# Patient Record
Sex: Female | Born: 1980 | Race: White | Hispanic: No | Marital: Married | Smoking: Never smoker
Health system: Southern US, Community
[De-identification: ages and names within clinical notes are randomized; demographics above are authoritative.]

## PROBLEM LIST (undated history)

## (undated) DIAGNOSIS — E079 Disorder of thyroid, unspecified: Secondary | ICD-10-CM

## (undated) DIAGNOSIS — IMO0002 Reserved for concepts with insufficient information to code with codable children: Secondary | ICD-10-CM

## (undated) HISTORY — PX: WISDOM TOOTH EXTRACTION: SHX21

## (undated) HISTORY — DX: Reserved for concepts with insufficient information to code with codable children: IMO0002

## (undated) HISTORY — DX: Disorder of thyroid, unspecified: E07.9

---

## 2004-11-01 ENCOUNTER — Emergency Department (HOSPITAL_COMMUNITY): Admission: EM | Admit: 2004-11-01 | Discharge: 2004-11-01 | Payer: Self-pay | Admitting: Emergency Medicine

## 2005-11-01 ENCOUNTER — Emergency Department (HOSPITAL_COMMUNITY): Admission: EM | Admit: 2005-11-01 | Discharge: 2005-11-02 | Payer: Self-pay | Admitting: Emergency Medicine

## 2009-11-24 ENCOUNTER — Encounter: Admission: RE | Admit: 2009-11-24 | Discharge: 2009-11-24 | Payer: Self-pay | Admitting: Family Medicine

## 2011-05-09 IMAGING — US US ABDOMEN COMPLETE
1 series · 13 of 25 positions shown · non-contrast
Comparison: None

CLINICAL DATA: Elevated liver function studies and nausea.

COMPLETE ABDOMINAL ULTRASOUND

[Series 1: us abdomen complete · 0.33mm/px · 13 of 57 slices shown]
[im 1/57]
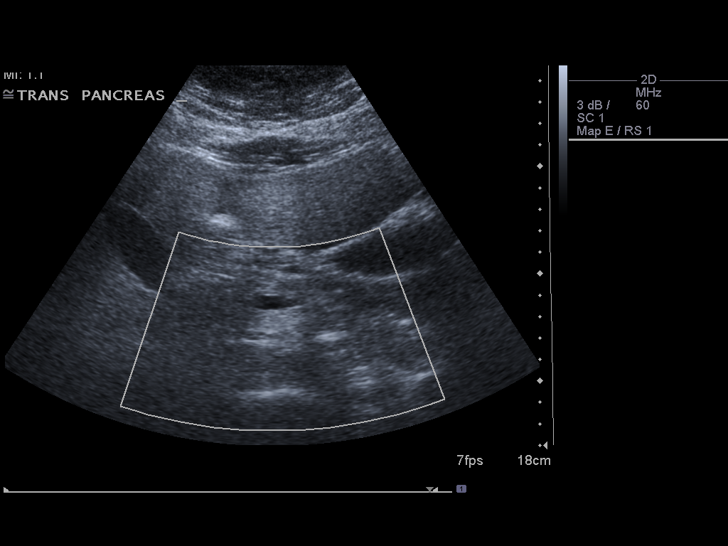
[im 5/57]
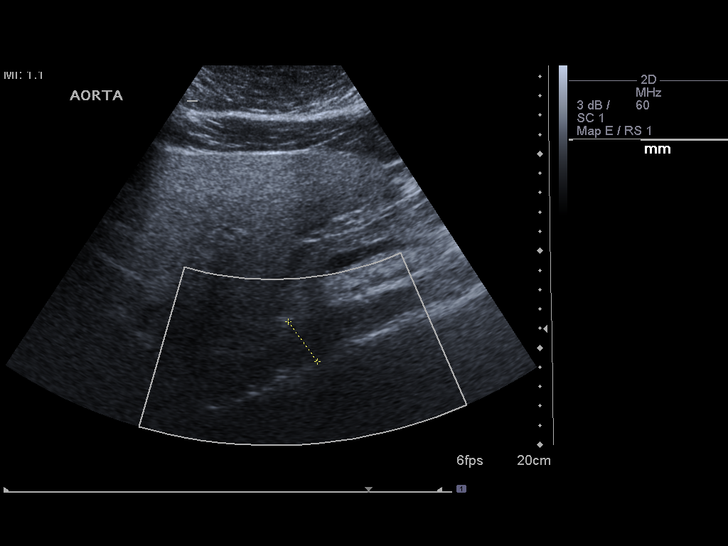
[im 10/57]
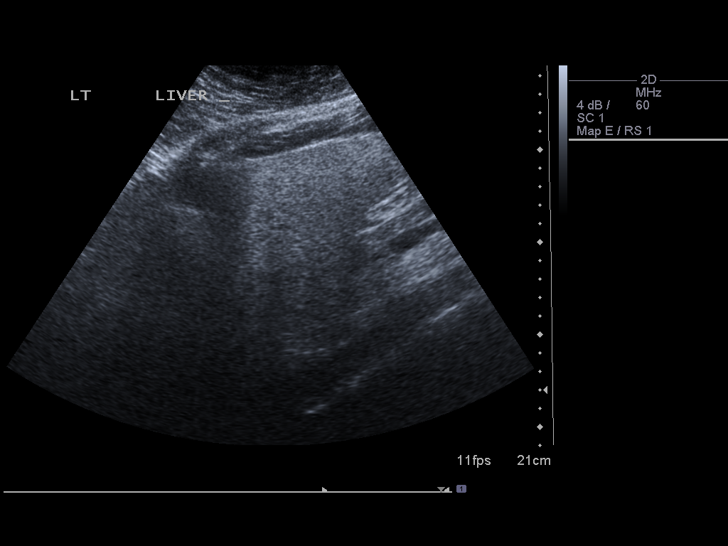
[im 15/57]
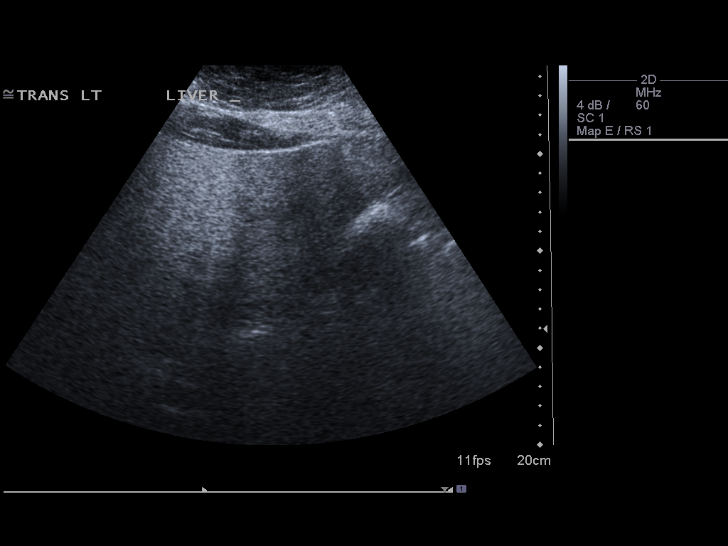
[im 19/57]
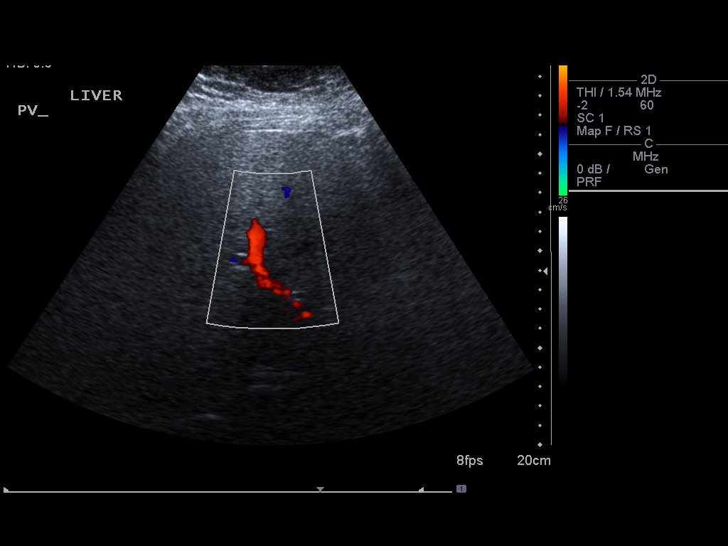
[im 24/57]
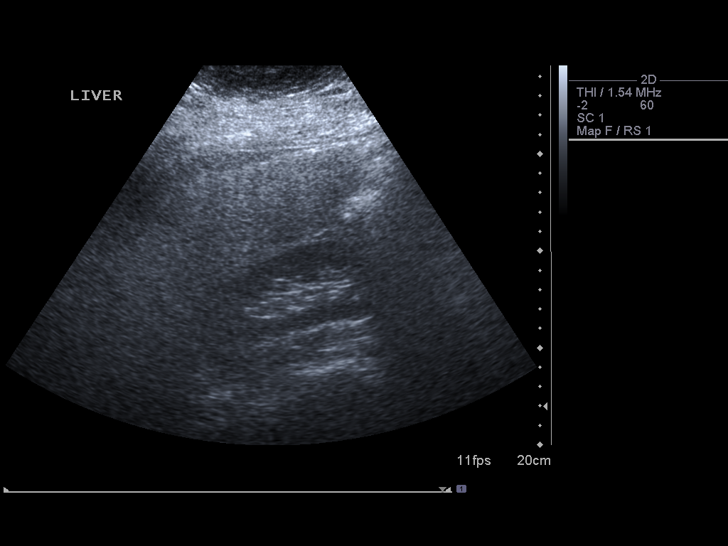
[im 29/57]
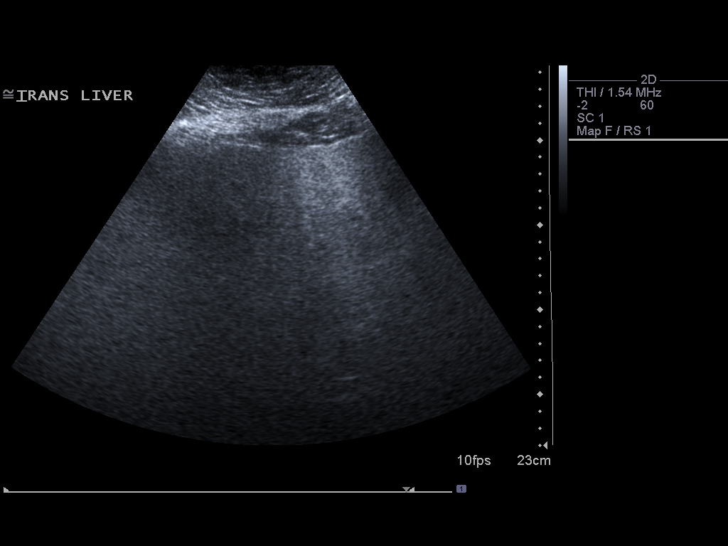
[im 33/57]
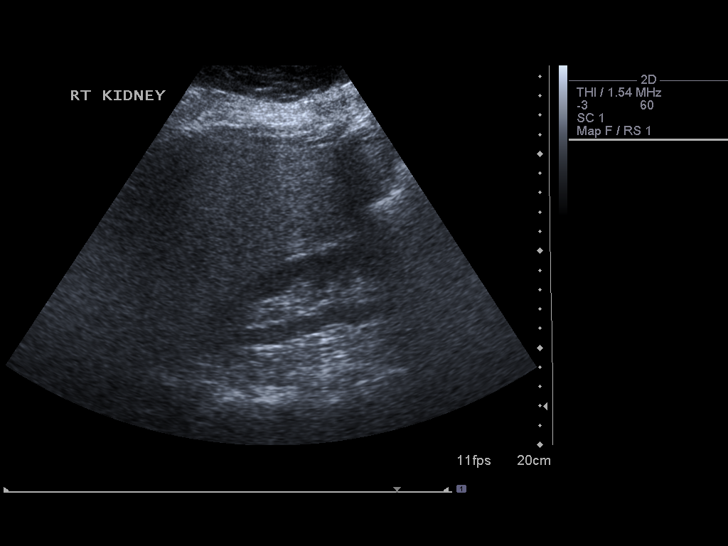
[im 38/57]
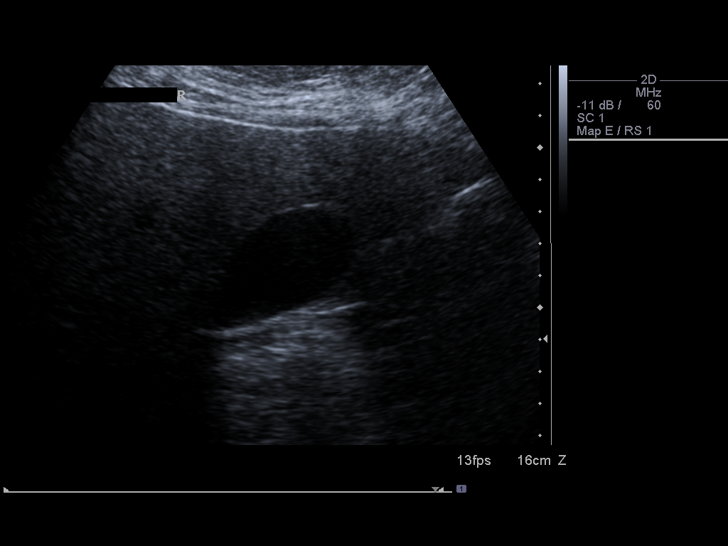
[im 43/57]
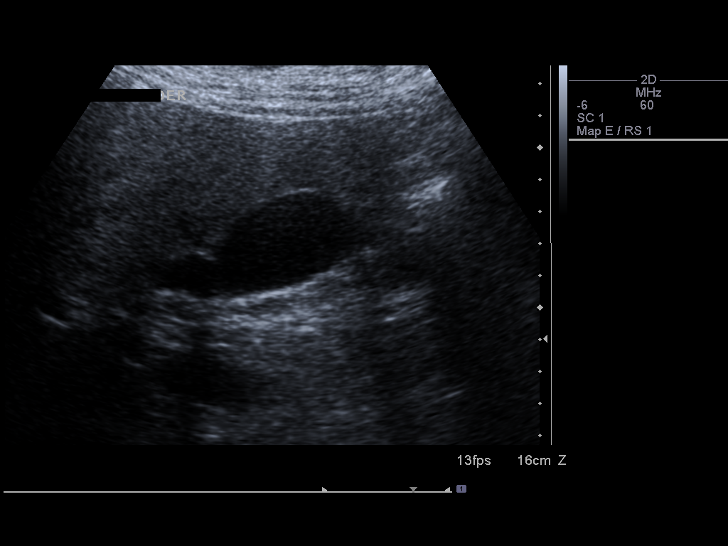
[im 47/57]
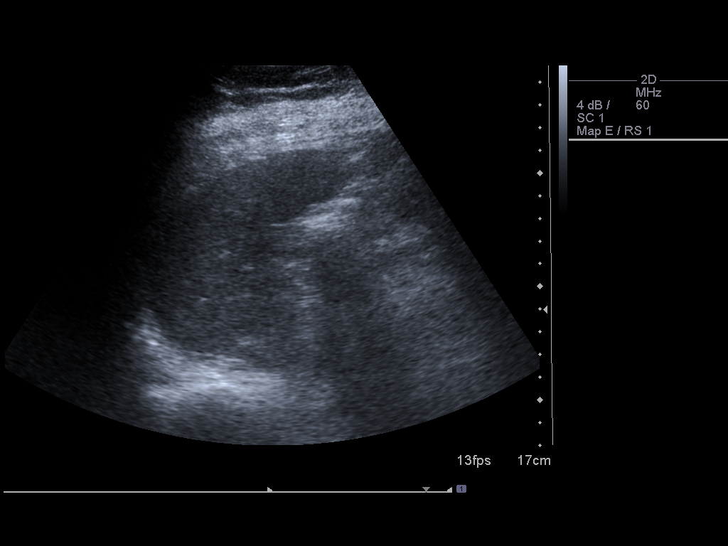
[im 52/57]
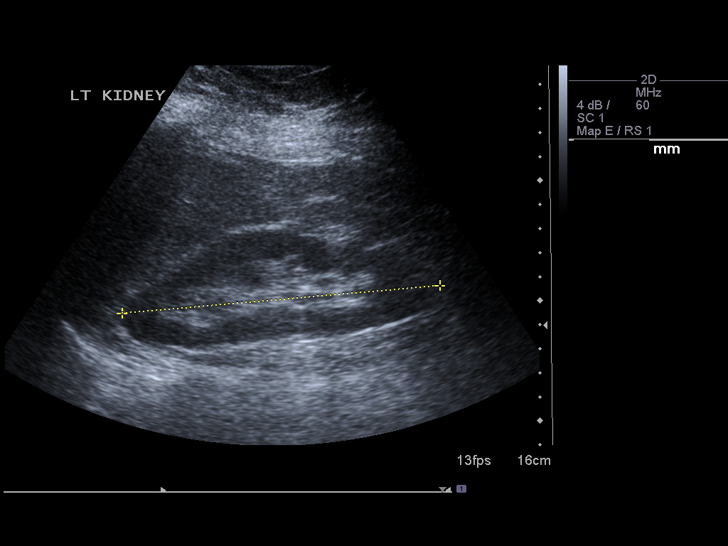
[im 57/57]
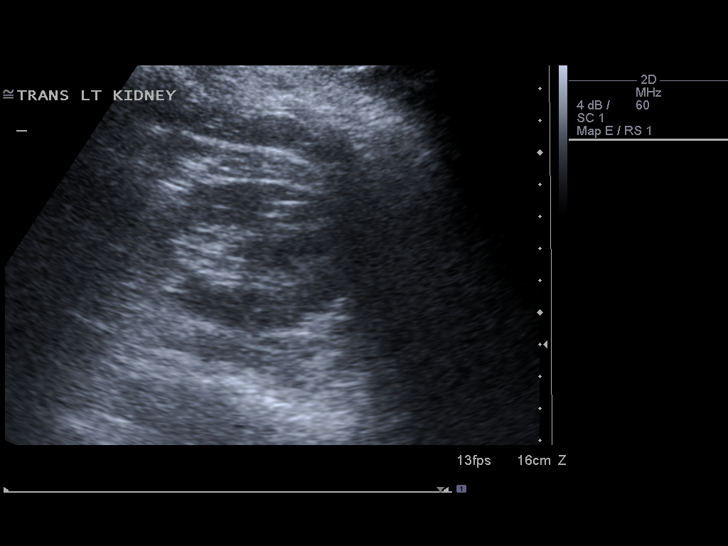

[13 of 25 positions shown; findings below may reference images not displayed]

FINDINGS: Gallbladder:  No gallstones, gallbladder wall thickening, or
pericholecystic fluid.

Common bile duct:  Normal in caliber measuring a maximum of 3.3 mm.

Liver:  Diffuse increased echogenicity with poor through
transmission and poor definition of the liver architecture
suggesting fatty infiltration.  There is an area of focal fatty
sparing near the gallbladder.

IVC:  Normal in caliber.

Pancreas:  Sonographically unremarkable.

Spleen:  Normal in size and echogenicity without focal lesions.

Right Kidney:  11.7 cm in length. Normal renal cortical thickness
and echogenicity without focal lesions or hydronephrosis.

Left Kidney:  13.3 cm in length. Normal renal cortical thickness
and echogenicity without focal lesions or hydronephrosis.

Abdominal aorta:  Normal caliber.
IMPRESSION: 1.  Diffuse fatty infiltration of the liver with area of probable
focal fatty sparing near the gallbladder.
2.  Normal sonographic appearance of the gallbladder and normal
caliber common bile duct.
3.  Normal sonographic appearance of the pancreas, spleen and both
kidneys.

## 2012-03-05 ENCOUNTER — Ambulatory Visit: Payer: Self-pay | Admitting: Family Medicine

## 2012-03-05 VITALS — HR 89 | Temp 98.9°F | Resp 18 | Ht 68.0 in | Wt 256.0 lb

## 2012-03-05 DIAGNOSIS — Z309 Encounter for contraceptive management, unspecified: Secondary | ICD-10-CM

## 2012-03-05 DIAGNOSIS — B379 Candidiasis, unspecified: Secondary | ICD-10-CM

## 2012-03-05 DIAGNOSIS — IMO0001 Reserved for inherently not codable concepts without codable children: Secondary | ICD-10-CM

## 2012-03-05 DIAGNOSIS — R21 Rash and other nonspecific skin eruption: Secondary | ICD-10-CM

## 2012-03-05 MED ORDER — NYSTATIN 100000 UNIT/GM EX POWD: Freq: Three times a day (TID) | CUTANEOUS | Status: AC

## 2012-03-05 MED ORDER — NYSTATIN 100000 UNIT/GM EX CREA: TOPICAL_CREAM | CUTANEOUS | Status: AC | PRN

## 2012-03-05 MED ORDER — NORGESTIM-ETH ESTRAD TRIPHASIC 0.18/0.215/0.25 MG-35 MCG PO TABS: 1.0000 | ORAL_TABLET | Freq: Every day | ORAL | Status: DC

## 2012-03-05 NOTE — Progress Notes (Signed)
[facility]:  Office Visit  Chief Complaint:  Chief Complaint  Patient presents with  . Rash    all over body     HPI: Andrea Stein is a 31 y.o. female who complains of  Rash x 2 months  in different parts of body, itchy, flaky, the peels and then weeps. When weeping tender, red patch. On back of neck, ears, legs, groin. Tried steroid cream without relief x 2 weeks. Patient is hypothyroid but denies diabetes, she had it check at [facility]. NO surgeries.   Past Medical History  Diagnosis Date  . Thyroid disease    History reviewed. No pertinent past surgical history. History   Social History  . Marital Status: Married    Spouse Name: N/A    Number of Children: N/A  . Years of Education: N/A   Social History Main Topics  . Smoking status: Never Smoker   . Smokeless tobacco: None  . Alcohol Use: Yes  . Drug Use: No  . Sexually Active: None   Other Topics Concern  . None   Social History Narrative  . None   Family History  Problem Relation Age of Onset  . Asthma Mother    Allergies  Allergen Reactions  . Sudafed (Pseudoephedrine Hcl) Other (See Comments)    Eye swelling    Prior to Admission medications   Medication Sig Start Date End Date Taking? Authorizing Provider  levothyroxine (SYNTHROID, LEVOTHROID) 112 MCG tablet Take 112 mcg by mouth daily.   Yes Historical Provider, MD  norethindrone-ethinyl estradiol-iron (ESTROSTEP FE,TILIA FE,TRI-LEGEST FE) 1-20/1-30/1-35 MG-MCG tablet Take 1 tablet by mouth daily.   Yes Historical Provider, MD     ROS: The patient denies fevers, chills, night sweats, unintentional weight loss, chest pain, palpitations, wheezing, dyspnea on exertion, nausea, vomiting, abdominal pain, dysuria, hematuria, melena, numbness, weakness, or tingling.  All other systems have been reviewed and were otherwise negative with the exception of those mentioned in the HPI and as above.    PHYSICAL EXAM: Filed Vitals:   03/05/12  1840  Pulse: 89  Temp: 98.9 F (37.2 C)  Resp: 18   Filed Vitals:   03/05/12 1840  Height: 5\' 8"  (1.727 m)  Weight: 256 lb (116.121 kg)   Body mass index is 38.92 kg/(m^2).  General: Alert, no acute distress,obese HEENT:  Normocephalic, atraumatic, oropharynx patent.  Cardiovascular:  Regular rate and rhythm, no rubs murmurs or gallops.  No Carotid bruits, radial pulse intact. No pedal edema.  Respiratory: Clear to auscultation bilaterally.  No wheezes, rales, or rhonchi.  No cyanosis, no use of accessory musculature GI: No organomegaly, abdomen is soft and non-tender, positive bowel sounds.  No masses. Skin: + Beefy erythematous patches on back of neck, underneath abdominal pannus, groin bialterally.+ scaly maculopapular rash on back of bilateral knees Neurologic: Facial musculature symmetric. Psychiatric: Patient is appropriate throughout our interaction. Lymphatic: No cervical lymphadenopathy Musculoskeletal: Gait intact.   LABS: No results found for this or any previous visit.   EKG/XRAY:   Primary read interpreted by Dr. Conley Rolls at [facility].   ASSESSMENT/PLAN: Encounter Diagnosis  Name Primary?  . Rash and nonspecific skin eruption Yes   Fugal infection on neck, groin, underneath abd pannus. ? Eczema with superimposed fungal rash behind knees Rx Nystatin cream for all parts of affected body except abdomen, patient may use nystatin powder under abdominal pannus Refill on BCP for 6 months until she can get her student insurance back for annual visit.  Hamilton Capri PHUONG, DO 03/05/2012 6:55 PM

## 2012-08-30 ENCOUNTER — Other Ambulatory Visit: Payer: Self-pay | Admitting: Family Medicine

## 2013-05-18 ENCOUNTER — Encounter (HOSPITAL_COMMUNITY): Payer: Self-pay | Admitting: Emergency Medicine

## 2013-05-18 ENCOUNTER — Emergency Department (HOSPITAL_COMMUNITY)
Admission: EM | Admit: 2013-05-18 | Discharge: 2013-05-19 | Disposition: A | Discharge status: Home / Self Care | Payer: BC Managed Care – PPO | Attending: Emergency Medicine | Admitting: Emergency Medicine

## 2013-05-18 ENCOUNTER — Ambulatory Visit (INDEPENDENT_AMBULATORY_CARE_PROVIDER_SITE_OTHER): Payer: BC Managed Care – PPO | Admitting: Family Medicine

## 2013-05-18 VITALS — BP 140/90 | HR 95 | Temp 98.3°F | Resp 18 | Ht 69.0 in | Wt 273.0 lb

## 2013-05-18 DIAGNOSIS — R110 Nausea: Secondary | ICD-10-CM | POA: Insufficient documentation

## 2013-05-18 DIAGNOSIS — Z79899 Other long term (current) drug therapy: Secondary | ICD-10-CM | POA: Insufficient documentation

## 2013-05-18 DIAGNOSIS — K625 Hemorrhage of anus and rectum: Secondary | ICD-10-CM | POA: Insufficient documentation

## 2013-05-18 DIAGNOSIS — IMO0002 Reserved for concepts with insufficient information to code with codable children: Secondary | ICD-10-CM | POA: Insufficient documentation

## 2013-05-18 DIAGNOSIS — E079 Disorder of thyroid, unspecified: Secondary | ICD-10-CM | POA: Insufficient documentation

## 2013-05-18 DIAGNOSIS — Z3202 Encounter for pregnancy test, result negative: Secondary | ICD-10-CM | POA: Insufficient documentation

## 2013-05-18 DIAGNOSIS — R109 Unspecified abdominal pain: Secondary | ICD-10-CM

## 2013-05-18 DIAGNOSIS — Z872 Personal history of diseases of the skin and subcutaneous tissue: Secondary | ICD-10-CM | POA: Insufficient documentation

## 2013-05-18 DIAGNOSIS — R5381 Other malaise: Secondary | ICD-10-CM | POA: Insufficient documentation

## 2013-05-18 LAB — POCT CBC
Hemoglobin: 15.2 g/dL (ref 12.2–16.2)
MCH, POC: 27.8 pg (ref 27–31.2)
MPV: 10.9 fL (ref 0–99.8)
POC Granulocyte: 7.2 — AB (ref 2–6.9)
POC LYMPH PERCENT: 34.2 %L (ref 10–50)
Platelet Count, POC: 159 10*3/uL (ref 142–424)
RBC: 5.47 M/uL (ref 4.04–5.48)
RDW, POC: 14.9 %
WBC: 12.7 10*3/uL — AB (ref 4.6–10.2)

## 2013-05-18 LAB — CBC WITH DIFFERENTIAL/PLATELET
Basophils Relative: 0 % (ref 0–1)
Eosinophils Relative: 4 % (ref 0–5)
Lymphs Abs: 3.6 10*3/uL (ref 0.7–4.0)
Monocytes Relative: 10 % (ref 3–12)
Neutro Abs: 5.9 10*3/uL (ref 1.7–7.7)
RBC: 5.25 MIL/uL — ABNORMAL HIGH (ref 3.87–5.11)
RDW: 13.8 % (ref 11.5–15.5)
WBC: 11.1 10*3/uL — ABNORMAL HIGH (ref 4.0–10.5)

## 2013-05-18 LAB — COMPREHENSIVE METABOLIC PANEL
Albumin: 3.6 g/dL (ref 3.5–5.2)
Alkaline Phosphatase: 48 U/L (ref 39–117)
Calcium: 9.4 mg/dL (ref 8.4–10.5)
Chloride: 101 mEq/L (ref 96–112)
Creatinine, Ser: 0.53 mg/dL (ref 0.50–1.10)
Potassium: 4 mEq/L (ref 3.5–5.1)
Sodium: 137 mEq/L (ref 135–145)
Total Protein: 7.5 g/dL (ref 6.0–8.3)

## 2013-05-18 LAB — IFOBT (OCCULT BLOOD): IFOBT: POSITIVE

## 2013-05-18 MED ORDER — SODIUM CHLORIDE 0.9 % IV BOLUS (SEPSIS)
1000.0000 mL | Freq: Once | INTRAVENOUS | Status: AC
  Administered 2013-05-19: 1000 mL via INTRAVENOUS

## 2013-05-18 NOTE — Patient Instructions (Addendum)
You are being discharged/transferred to the hospital for further evaluation of your rectal bleeding.

## 2013-05-18 NOTE — ED Notes (Addendum)
Pt states for the last week had light rectal bleeding. Hx of ulcers. States today around 5pm, pt thought she needed to have a BM, but it was just bright blood that filled the toilet. Pt c/o exhaustion, nausea, eating less. Pt in NAD. States she went to the doctor right before coming ED, they did blood work and stated that her hemoglobin was "fine". Denies feeling dizzy, SOB.

## 2013-05-18 NOTE — Progress Notes (Signed)
Subjective:    Patient ID: Andrea Stein, female    DOB: August 04, 1981, 32 y.o.   MRN: [id number]  HPI 32 yo female with complaint of rectal bleeding, onset one week ago.  Now worsening.  She notes having 24 hours of GI diarrhea and abdominal cramping just prior to this starting.  No abdominal pain.  Fullness with BM.  No diarrhea since last week.  Formed stool with blood, then today large amount of just blood.  No f/c.  No nausea or vomiting.  History of peptic ulcer disease, denies stomach pain at present.  Positive lightheaded with sitting or standing.  No syncope.  Past Medical History  Diagnosis Date  . Thyroid disease   . Ulcer    History reviewed. No pertinent past surgical history. Allergies  Allergen Reactions  . Sudafed [Pseudoephedrine Hcl] Other (See Comments)    Eye swelling   . Dramamine [Dimenhydrinate] Anxiety  . Vicodin [Hydrocodone-Acetaminophen] Anxiety    Review of Systems  Constitutional: Negative for fever, chills and unexpected weight change.  Respiratory: Negative for cough, chest tightness, shortness of breath, wheezing and stridor.   Cardiovascular: Negative for chest pain and palpitations.  Gastrointestinal: Positive for abdominal pain, blood in stool and anal bleeding. Negative for nausea, vomiting, diarrhea, constipation, abdominal distention and rectal pain.  Genitourinary: Negative for dysuria, hematuria and vaginal bleeding.  Musculoskeletal: Negative for back pain.  Skin: Negative for color change and pallor.  Neurological: Positive for light-headedness. Negative for dizziness, syncope, weakness and numbness.       Objective:   Physical Exam Blood pressure 140/90, pulse 95, temperature 98.3 F (36.8 C), temperature source Oral, resp. rate 18, height 5\' 9"  (1.753 m), weight 273 lb (123.832 kg), last menstrual period 05/11/2013, SpO2 98.00%. Body mass index is 40.3 kg/(m^2). Well-developed, well nourished female who is awake, alert and oriented, in  NAD. HEENT: Lane/AT, PERRL, EOMI.  Pale conjuctiva. TMs are normal in appearance. Nasal mucosa is pink and moist. OP is clear. Neck: supple, non-tender, no lymphadenopathy, thyromegaly. Heart: RRR, no murmur Lungs: normal effort, CTA Abdomen: normo-active bowel sounds, supple, non-tender, no mass or organomegaly Rectal Exam: nontender, no external hemorrhoids, grossly bloody on exam. Extremities: no cyanosis, clubbing or edema. Skin: warm and dry without rash, pale Psychologic: good mood and appropriate affect, normal speech and behavior.  Results for orders placed in visit on 05/18/13  POCT CBC      Result Value Range   WBC 12.7 (*) 4.6 - 10.2 K/uL   Lymph, poc 4.3 (*) 0.6 - 3.4   POC LYMPH PERCENT 34.2  10 - 50 %L   MID (cbc) 1.2 (*) 0 - 0.9   POC MID % 9.1  0 - 12 %M   POC Granulocyte 7.2 (*) 2 - 6.9   Granulocyte percent 56.7  37 - 80 %G   RBC 5.47  4.04 - 5.48 M/uL   Hemoglobin 15.2  12.2 - 16.2 g/dL   HCT, POC 84.1  32.4 - 47.9 %   MCV 86.3  80 - 97 fL   MCH, POC 27.8  27 - 31.2 pg   MCHC 32.2  31.8 - 35.4 g/dL   RDW, POC 40.1     Platelet Count, POC 159  142 - 424 K/uL   MPV 10.9  0 - 99.8 fL  IFOBT (OCCULT BLOOD)      Result Value Range   IFOBT Positive         Assessment & Plan:  Rectal bleeding consistent  with acute colitis.  Stable BP and HR with mild symptoms lightheaded.  Plan:  Check CBC Hgb normal, however with unknown source and large amount of blood I feel she should be evaluated in hospital for observation and repeat Hgb monitoring or definitive identification of bleeding source.  Discussed with patient and will transfer to [facility] for further evaluation.

## 2013-05-19 LAB — URINALYSIS, ROUTINE W REFLEX MICROSCOPIC
Bilirubin Urine: NEGATIVE
Glucose, UA: NEGATIVE mg/dL
Hgb urine dipstick: NEGATIVE
Ketones, ur: NEGATIVE mg/dL
Leukocytes, UA: NEGATIVE
Nitrite: NEGATIVE
Protein, ur: NEGATIVE mg/dL
Specific Gravity, Urine: 1.039 — ABNORMAL HIGH (ref 1.005–1.030)
Urobilinogen, UA: 0.2 mg/dL (ref 0.0–1.0)
pH: 5 (ref 5.0–8.0)

## 2013-05-19 LAB — OCCULT BLOOD, POC DEVICE: Fecal Occult Bld: POSITIVE — AB

## 2013-05-19 NOTE — ED Provider Notes (Signed)
CSN: [id number]     Arrival date & time 05/18/13  2052 History   First MD Initiated Contact with Patient 05/18/13 2348     Chief Complaint  Patient presents with  . Rectal Bleeding   (Consider location/radiation/quality/duration/timing/severity/associated sxs/prior Treatment) HPI Patient present to the emergency department with rectal bleeding.  That was heavier today.  She, states she's had some mild bleeding, over the last week.  Patient, states she's felt some increased fatigue with nausea.  Patient denies chest pain, shortness of breath vomiting headache, blurred vision, hematemesis hemoptysis, fever, back pain, or dysuria.  Patient also denies syncope.  Patient, states she did not take any medications prior to arrival   Past Medical History  Diagnosis Date  . Thyroid disease   . Ulcer    Past Surgical History  Procedure Laterality Date  . Wisdom tooth extraction     Family History  Problem Relation Age of Onset  . Asthma Mother    History  Substance Use Topics  . Smoking status: Never Smoker   . Smokeless tobacco: Never Used  . Alcohol Use: Yes     Comment: rarely   OB History   Grav Para Term Preterm Abortions TAB SAB Ect Mult Living                 Review of Systems All other systems negative except as documented in the HPI. All pertinent positives and negatives as reviewed in the HPI. Allergies  Sudafed; Dramamine; and Vicodin  Home Medications   Current Outpatient Rx  Name  Route  Sig  Dispense  Refill  . levothyroxine (SYNTHROID, LEVOTHROID) 112 MCG tablet   Oral   Take 175 mcg by mouth daily.          . norethindrone-ethinyl estradiol-iron (ESTROSTEP FE,TILIA FE,TRI-LEGEST FE) 1-20/1-30/1-35 MG-MCG tablet   Oral   Take 1 tablet by mouth daily.          BP 148/105  Pulse 82  Temp(Src) 99 F (37.2 C) (Oral)  Resp 20  Ht 5\' 8"  (1.727 m)  Wt 265 lb (120.203 kg)  BMI 40.3 kg/m2  SpO2 98%  LMP 05/11/2013 Physical Exam  Nursing note and vitals  reviewed. Constitutional: She is oriented to person, place, and time. She appears well-developed and well-nourished. No distress.  HENT:  Head: Normocephalic and atraumatic.  Mouth/Throat: Oropharynx is clear and moist.  Eyes: Pupils are equal, round, and reactive to light.  Neck: Normal range of motion. Neck supple.  Cardiovascular: Normal rate, regular rhythm and normal heart sounds.  Exam reveals no gallop and no friction rub.   No murmur heard. Pulmonary/Chest: Effort normal and breath sounds normal. No respiratory distress.  Abdominal: Soft. Bowel sounds are normal. She exhibits no distension. There is no tenderness. There is no rebound and no guarding.  Neurological: She is alert and oriented to person, place, and time.  Skin: Skin is warm and dry.    ED Course  Procedures (including critical care time) Labs Review Labs Reviewed  CBC WITH DIFFERENTIAL - Abnormal; Notable for the following:    WBC 11.1 (*)    RBC 5.25 (*)    Monocytes Absolute 1.1 (*)    All other components within normal limits  COMPREHENSIVE METABOLIC PANEL - Abnormal; Notable for the following:    Glucose, Bld 116 (*)    AST 68 (*)    ALT 96 (*)    All other components within normal limits  OCCULT BLOOD, POC DEVICE - Abnormal;  Notable for the following:    Fecal Occult Bld POSITIVE (*)    All other components within normal limits  URINALYSIS, ROUTINE W REFLEX MICROSCOPIC  POCT PREGNANCY, URINE   Patient has no gross blood per rectum.  Patient has trace positive Hemoccult.  Patient is been stable and has normal vital signs her GI for further evaluation.  Patient's told to return here for any worsening in her condition  MDM      Carlyle Dolly, PA-C 05/19/13 0132

## 2013-05-19 NOTE — ED Provider Notes (Signed)
  Medical screening examination/treatment/procedure(s) were performed by non-physician practitioner and as supervising physician I was immediately available for consultation/collaboration.    Gerhard Munch, MD 05/19/13 2351

## 2014-09-11 ENCOUNTER — Ambulatory Visit (INDEPENDENT_AMBULATORY_CARE_PROVIDER_SITE_OTHER): Payer: BLUE CROSS/BLUE SHIELD | Admitting: Emergency Medicine

## 2014-09-11 ENCOUNTER — Ambulatory Visit (INDEPENDENT_AMBULATORY_CARE_PROVIDER_SITE_OTHER): Payer: BLUE CROSS/BLUE SHIELD

## 2014-09-11 VITALS — BP 146/90 | HR 122 | Temp 97.5°F | Resp 20 | Ht 68.0 in | Wt 278.0 lb

## 2014-09-11 DIAGNOSIS — R05 Cough: Secondary | ICD-10-CM

## 2014-09-11 DIAGNOSIS — R059 Cough, unspecified: Secondary | ICD-10-CM

## 2014-09-11 LAB — POCT CBC
Granulocyte percent: 70.5 %G (ref 37–80)
HEMATOCRIT: 44.7 % (ref 37.7–47.9)
Hemoglobin: 14.7 g/dL (ref 12.2–16.2)
LYMPH, POC: 2.6 (ref 0.6–3.4)
MCH, POC: 28.5 pg (ref 27–31.2)
MCHC: 32.9 g/dL (ref 31.8–35.4)
MCV: 86.5 fL (ref 80–97)
MID (CBC): 0.7 (ref 0–0.9)
MPV: 7.9 fL (ref 0–99.8)
PLATELET COUNT, POC: 295 10*3/uL (ref 142–424)
POC GRANULOCYTE: 7.8 — AB (ref 2–6.9)
POC LYMPH PERCENT: 23.3 %L (ref 10–50)
POC MID %: 6.2 %M (ref 0–12)
RBC: 5.16 M/uL (ref 4.04–5.48)
RDW, POC: 13.7 %
WBC: 11.1 10*3/uL — AB (ref 4.6–10.2)

## 2014-09-11 LAB — POCT RAPID STREP A (OFFICE): Rapid Strep A Screen: NEGATIVE

## 2014-09-11 MED ORDER — BENZONATATE 100 MG PO CAPS: 100.0000 mg | ORAL_CAPSULE | Freq: Three times a day (TID) | ORAL | Status: AC | PRN

## 2014-09-11 MED ORDER — AZITHROMYCIN 250 MG PO TABS: ORAL_TABLET | ORAL | Status: AC

## 2014-09-11 NOTE — Progress Notes (Signed)
Subjective:    Patient ID: Andrea Stein, female    DOB: [birth year], 34 y.o.   MRN: [id number]  This chart was scribed for Lucilla Edin, MD by Ronney Lion, ED Scribe. This patient was seen in room 13 and the patient's care was started at 12:56 PM.   Sore Throat  Associated symptoms include congestion and coughing.    Chief Complaint  Patient presents with  . Cough    sick with UR symptoms x 1 and 1/2 week, now cough has gotten worse   . Sore Throat     HPI Comments: Andrea Stein is a 34 y.o. female who presents to the [facility] complaining of congestion with clear mucus and fatigue that began that began 1.5 weeks ago but which worsened several days after when she went out of state to [place], with severe productive cough with blood-tinged sputum, nasal congestion with green and yellow phlegm, and intermittent sore throat that is still persisting. She complains of associated sleep disturbance due to her symptoms and one episode of bladder incontinence due to severe coughing. She has taken Nyquil Severe Nighttime Cold and Flu, which helped her sleep better. She denies smoking.   Patient's LMP was 2 weeks ago; she is on BCP. She has known allergies to Sudafed, Dramamine, and Vicodin.  Allergies  Allergen Reactions  . Sudafed [Pseudoephedrine Hcl] Other (See Comments)    Eye swelling   . Dramamine [Dimenhydrinate] Anxiety  . Vicodin [Hydrocodone-Acetaminophen] Anxiety     Review of Systems  Constitutional: Positive for fatigue.  HENT: Positive for congestion and sore throat.   Respiratory: Positive for cough.        Objective:   Physical Exam  Nursing note and vitals reviewed.  CONSTITUTIONAL: Well developed/well nourished HEAD: Normocephalic/atraumatic; significant nasal congestion; slightly erythematous oropharynx EYES: EOMI/PERRL ENMT: Mucous membranes moist NECK: supple no meningeal signs SPINE/BACK:entire spine nontender CV: S1/S2 noted, no  murmurs/rubs/gallops noted LUNGS: Lungs are clear to auscultation bilaterally, no apparent distress ABDOMEN: soft, nontender, no rebound or guarding, bowel sounds noted throughout abdomen GU:no cva tenderness NEURO: Pt is awake/alert/appropriate, moves all extremitiesx4.  No facial droop.   EXTREMITIES: pulses normal/equal, full ROM SKIN: warm, color normal PSYCH: no abnormalities of mood noted, alert and oriented to situation   [facility] reading (PRIMARY) by  Dr.Luismanuel Corman there appears to some mild increased markings in the right infrahilar area. There is no evidence of consolidated pneumonia. Results for orders placed or performed in visit on 09/11/14  POCT CBC  Result Value Ref Range   WBC 11.1 (A) 4.6 - 10.2 K/uL   Lymph, poc 2.6 0.6 - 3.4   POC LYMPH PERCENT 23.3 10 - 50 %L   MID (cbc) 0.7 0 - 0.9   POC MID % 6.2 0 - 12 %M   POC Granulocyte 7.8 (A) 2 - 6.9   Granulocyte percent 70.5 37 - 80 %G   RBC 5.16 4.04 - 5.48 M/uL   Hemoglobin 14.7 12.2 - 16.2 g/dL   HCT, POC 13.2 44.0 - 47.9 %   MCV 86.5 80 - 97 fL   MCH, POC 28.5 27 - 31.2 pg   MCHC 32.9 31.8 - 35.4 g/dL   RDW, POC 10.2 %   Platelet Count, POC 295 142 - 424 K/uL   MPV 7.9 0 - 99.8 fL  POCT rapid strep A  Result Value Ref Range   Rapid Strep A Screen Negative Negative       Assessment &  Plan:  Patient placed on Tessalon Perles and a Z-Pak. There is some mild increased markings inferior right hilum with a white count 11,000.I personally performed the services described in this documentation, which was scribed in my presence. The recorded information has been reviewed and is accurate.

## 2015-04-07 ENCOUNTER — Other Ambulatory Visit: Payer: Self-pay | Admitting: Family Medicine

## 2015-04-07 DIAGNOSIS — R1011 Right upper quadrant pain: Secondary | ICD-10-CM

## 2015-04-11 ENCOUNTER — Ambulatory Visit
Admission: RE | Admit: 2015-04-11 | Discharge: 2015-04-11 | Disposition: A | Discharge status: Home / Self Care | Payer: BLUE CROSS/BLUE SHIELD | Source: Ambulatory Visit | Attending: Family Medicine | Admitting: Family Medicine

## 2015-04-11 DIAGNOSIS — R1011 Right upper quadrant pain: Secondary | ICD-10-CM

## 2016-02-24 IMAGING — CR DG CHEST 2V
2 series · 2 of 2 positions shown · non-contrast
Comparison: None.

CLINICAL DATA: Cough and congestion

EXAM:
CHEST  2 VIEW

[PA]
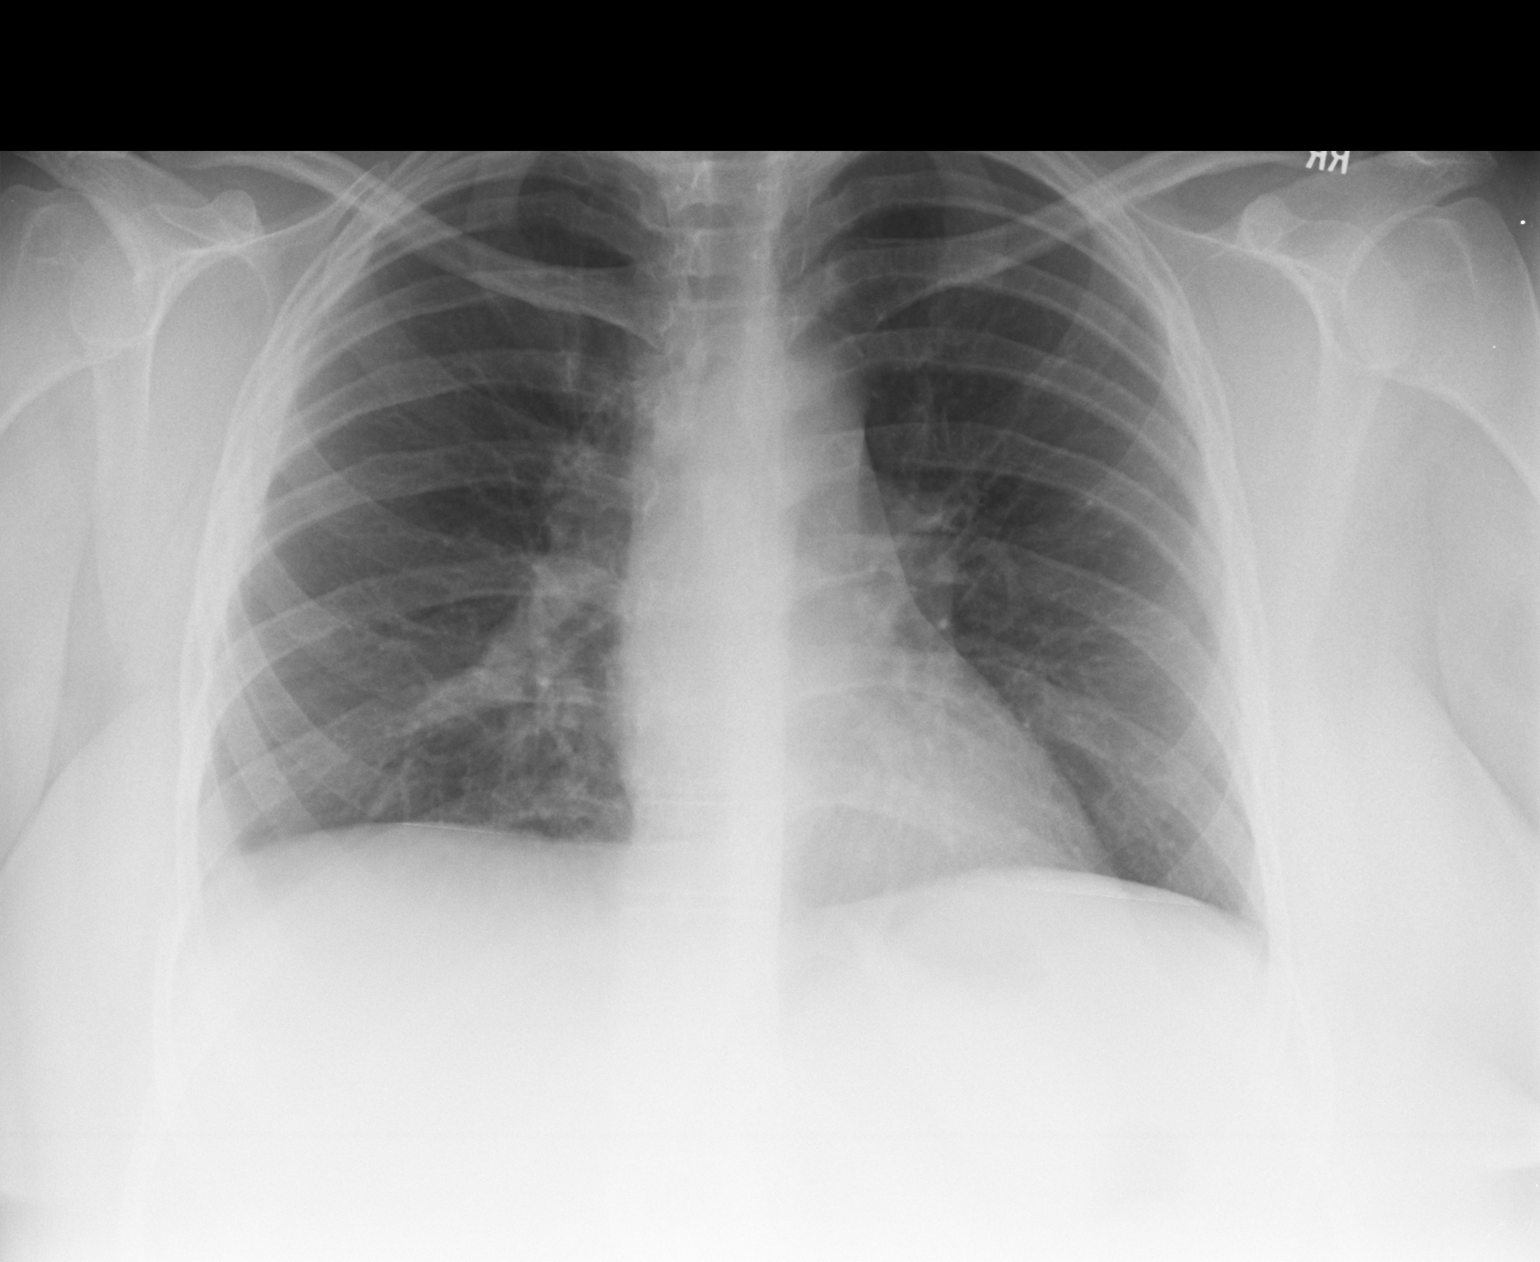

[lateral]
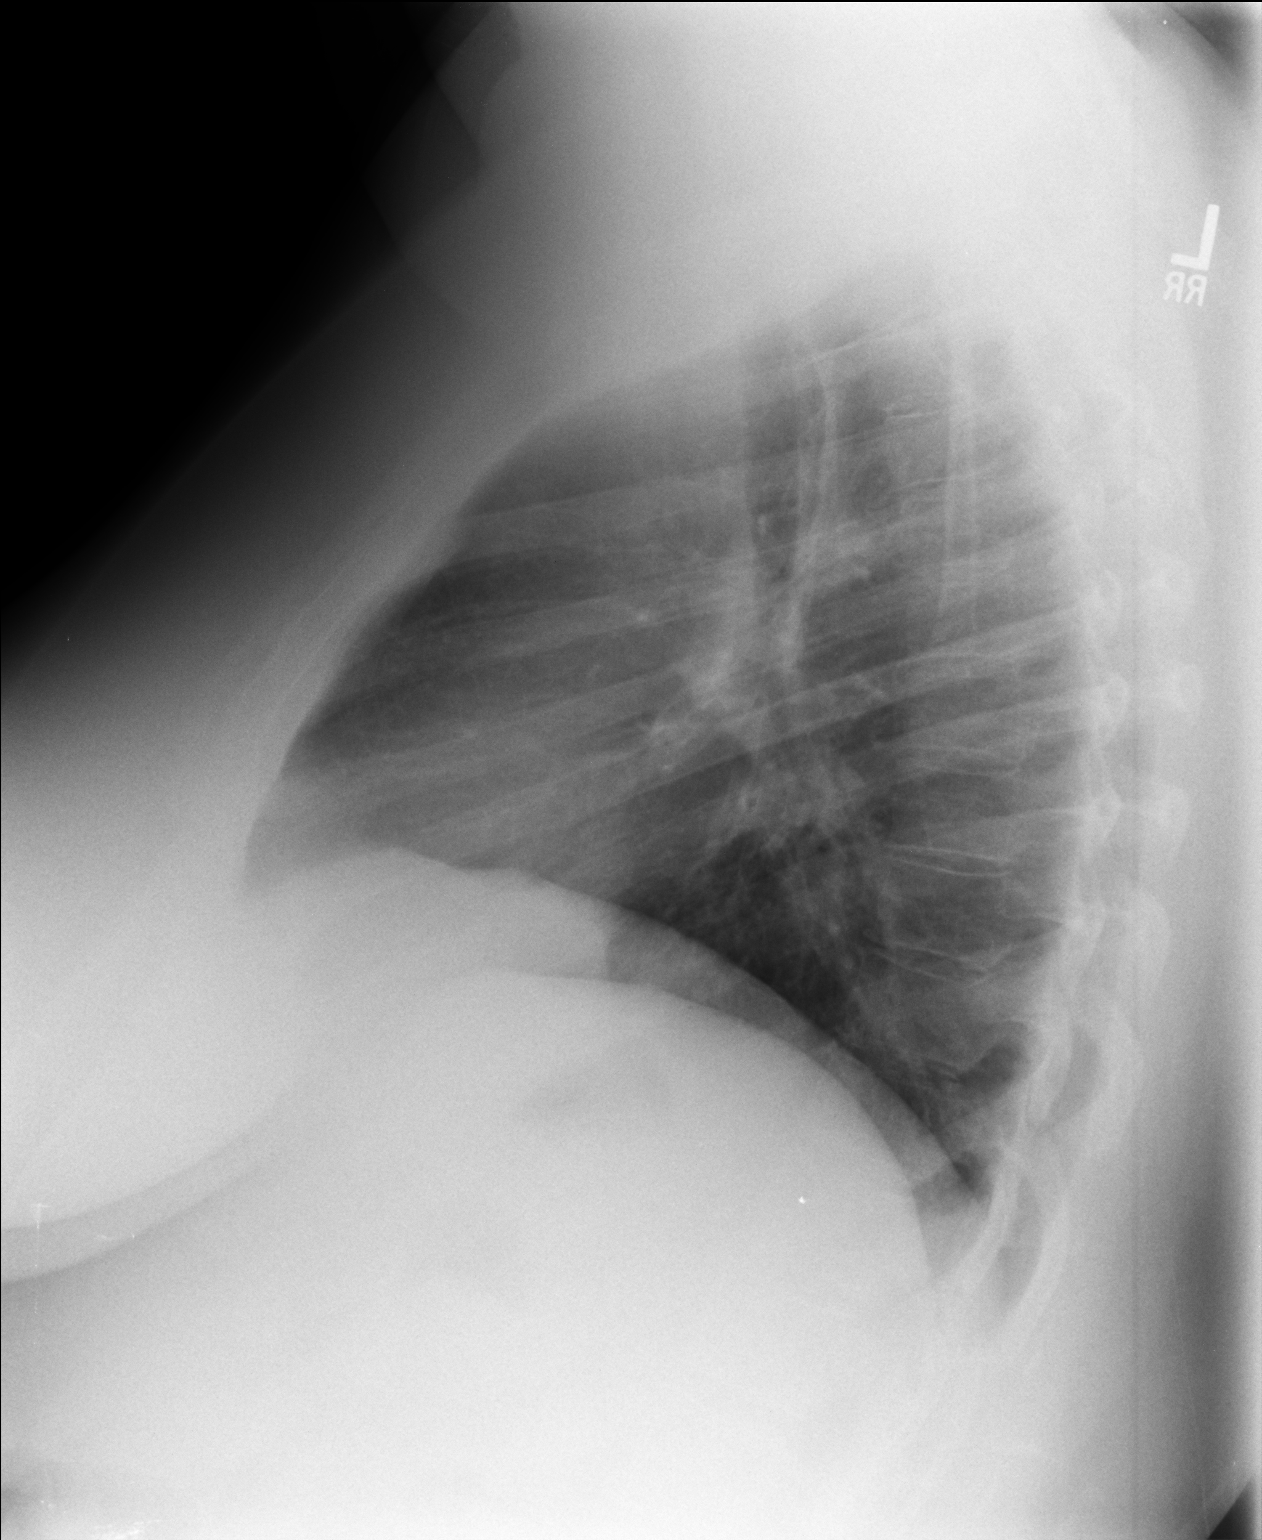

[2 of 2 positions shown; findings below may reference images not displayed]

FINDINGS: Cardiac shadow is within normal limits. Patient is somewhat rotated
to the left accentuating the mediastinal markings. No focal
infiltrate or sizable effusion is noted.
IMPRESSION: No acute abnormality noted.

## 2016-09-23 IMAGING — US US ABDOMEN LIMITED
1 series · 14 of 25 positions shown · non-contrast
Comparison: Abdominal ultrasound November 24, 2009

CLINICAL DATA: Re-evaluate known fatty liver disease, question
right upper quadrant pain

EXAM:
US ABDOMEN LIMITED - RIGHT UPPER QUADRANT

[Series 1: us abdomen limited · 0.43mm/px · 14 of 40 slices shown]
[im 1/40]
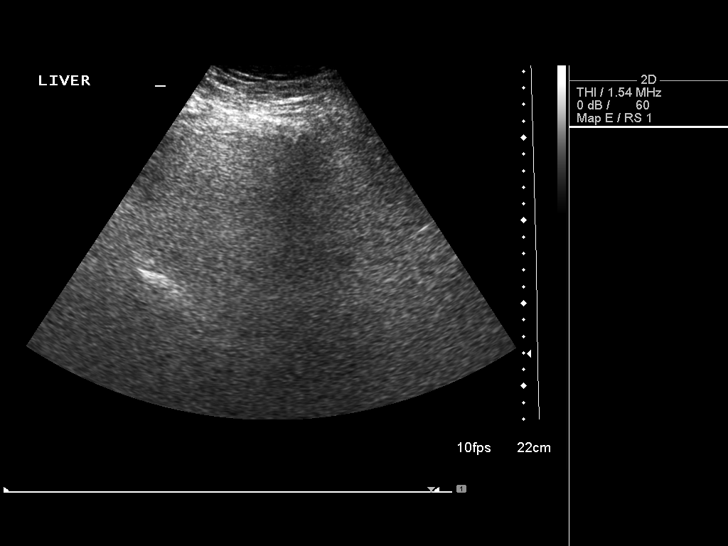
[im 4/40]
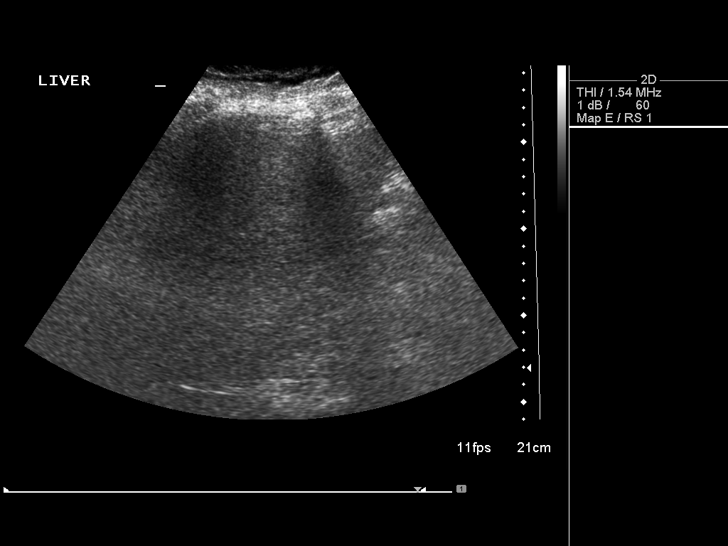
[im 7/40]
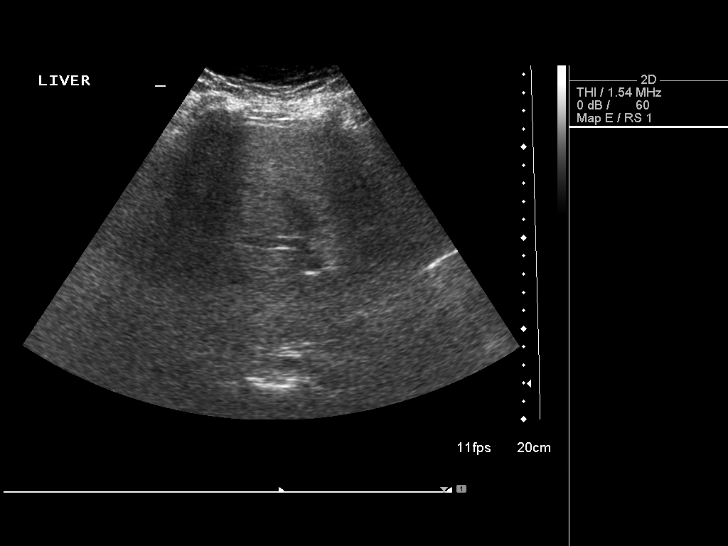
[im 10/40]
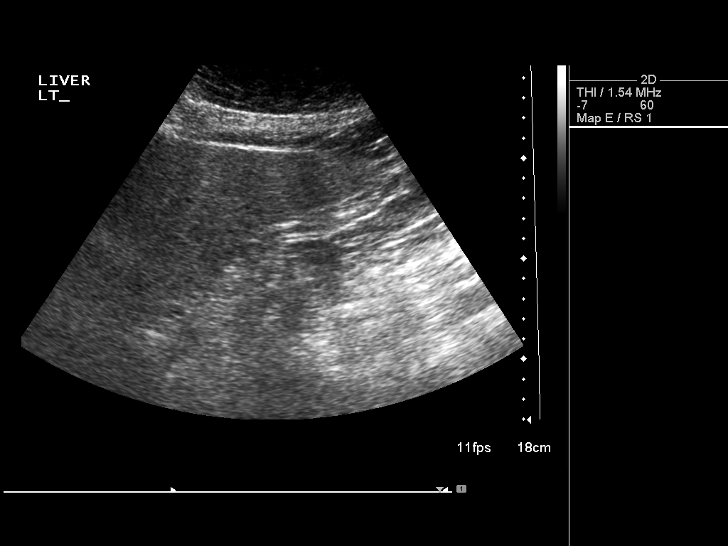
[im 14/40]
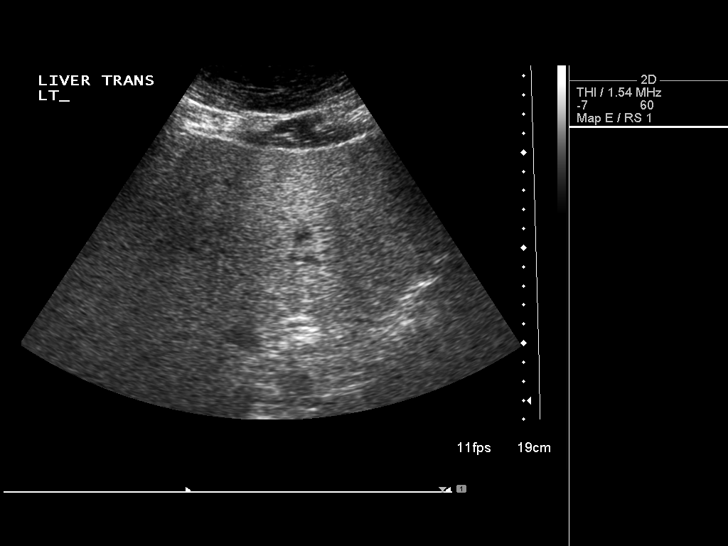
[im 15/40]
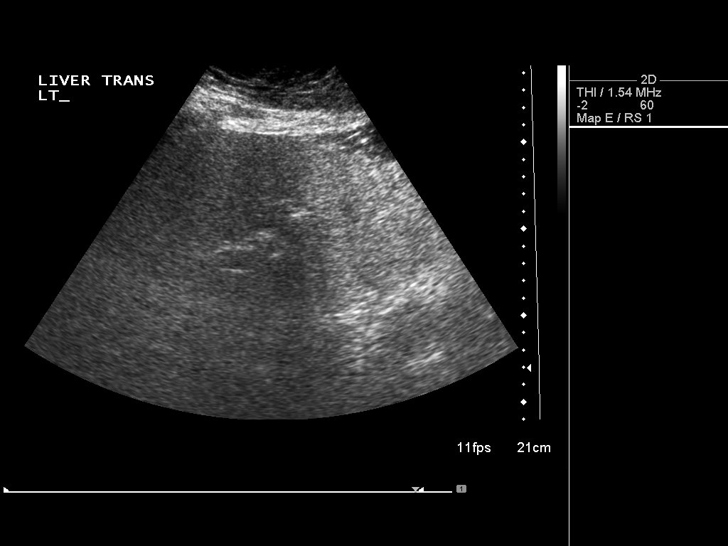
[im 18/40]
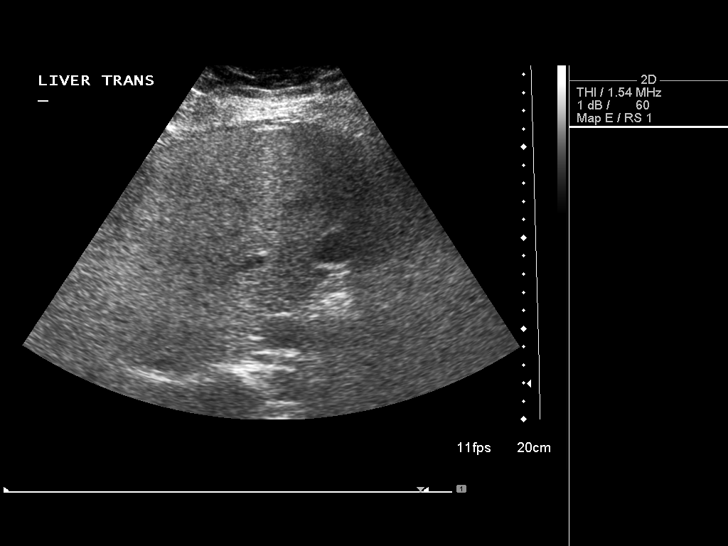
[im 22/40]
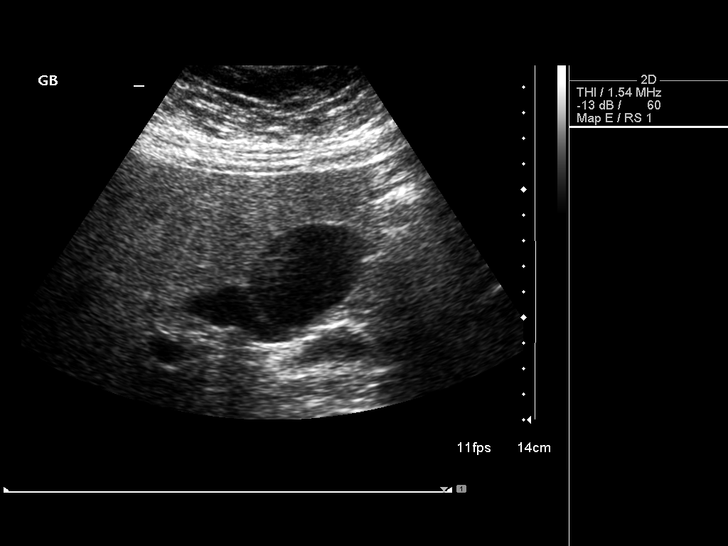
[im 25/40]
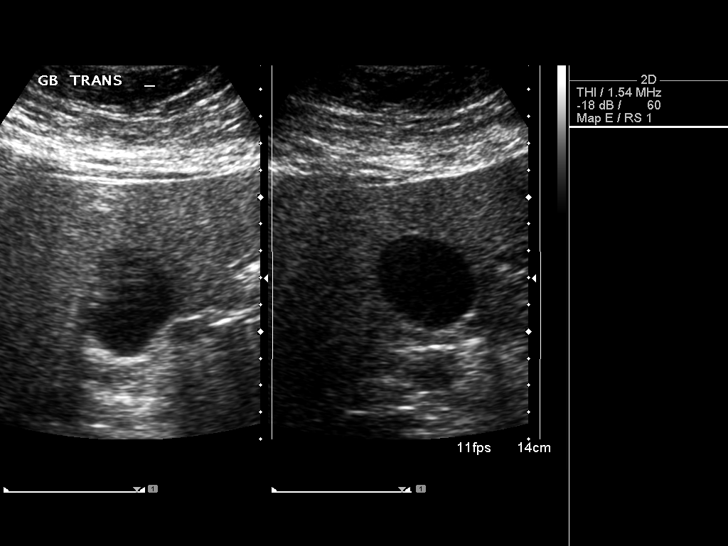
[im 27/40]
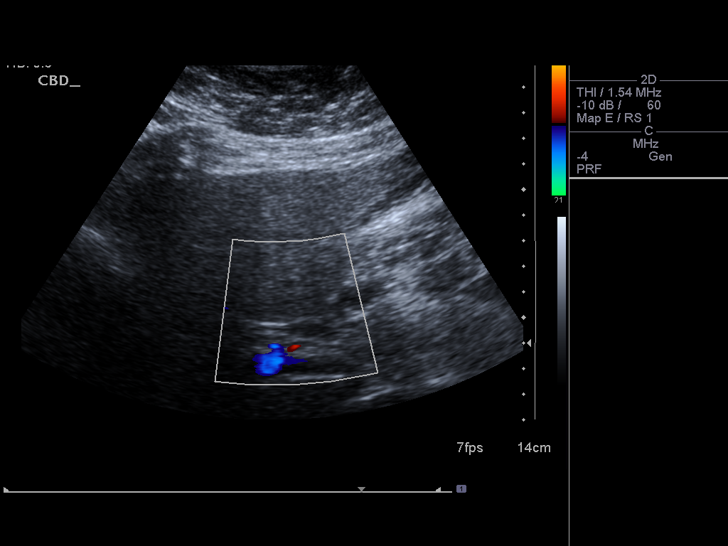
[im 30/40]
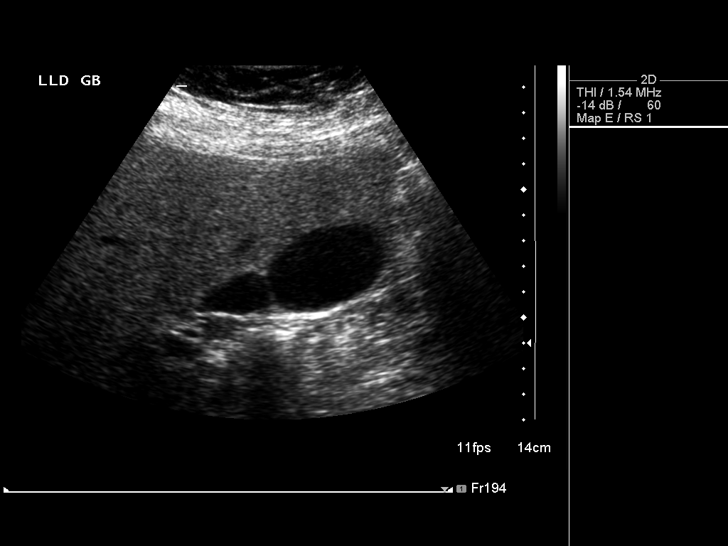
[im 33/40]
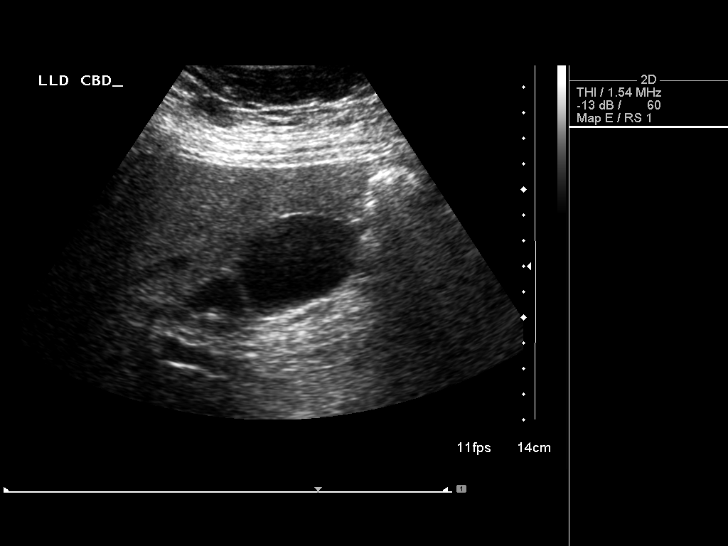
[im 36/40]
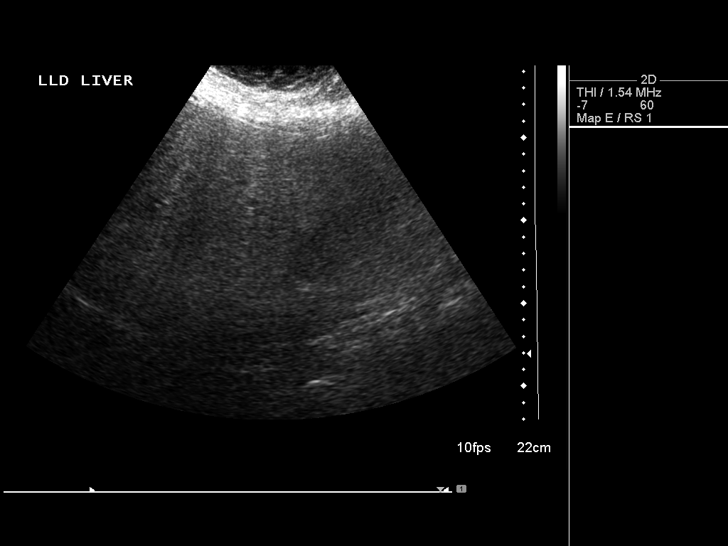
[im 40/40]
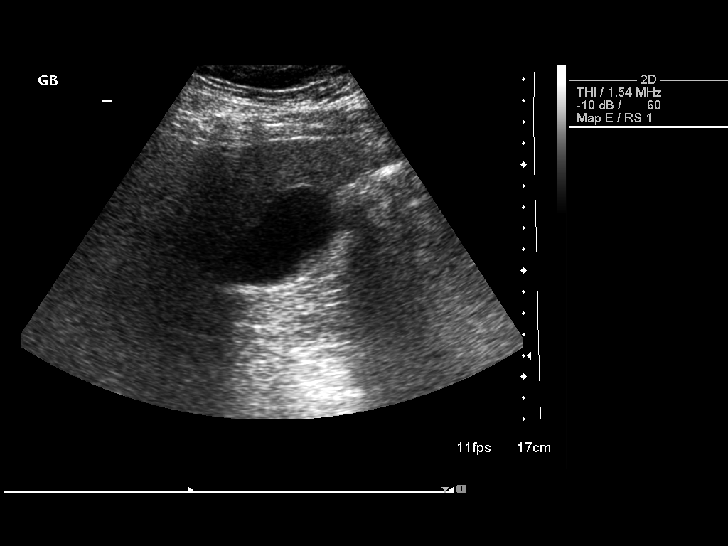

[14 of 25 positions shown; findings below may reference images not displayed]

FINDINGS: Gallbladder:

No gallstones or wall thickening visualized. No sonographic Murphy
sign noted.

Common bile duct:

Diameter: 2.2 mm

Liver:

The hepatic echotexture is increased which limits penetration by the
ultrasound beam. The echotexture mildly increases also
heterogeneous. No discrete mass is observed.
IMPRESSION: Increased hepatic echotexture with heterogeneous appearance which
limits the study. If there are clinical or laboratory abnormalities,
MRI of the liver would be useful. The gallbladder and common bile
duct are normal.
